# Patient Record
Sex: Female | Born: 1945
Health system: Southern US, Community
[De-identification: ages and names within clinical notes are randomized; demographics above are authoritative.]

## PROBLEM LIST (undated history)

## (undated) DIAGNOSIS — I10 Essential (primary) hypertension: Secondary | ICD-10-CM

## (undated) DIAGNOSIS — M109 Gout, unspecified: Secondary | ICD-10-CM

## (undated) DIAGNOSIS — F419 Anxiety disorder, unspecified: Secondary | ICD-10-CM

## (undated) DIAGNOSIS — C649 Malignant neoplasm of unspecified kidney, except renal pelvis: Secondary | ICD-10-CM

## (undated) DIAGNOSIS — F319 Bipolar disorder, unspecified: Secondary | ICD-10-CM

## (undated) HISTORY — PX: CHOLECYSTECTOMY: SHX55

## (undated) HISTORY — PX: NEPHRECTOMY: SHX65

---

## 2015-11-16 ENCOUNTER — Emergency Department (HOSPITAL_BASED_OUTPATIENT_CLINIC_OR_DEPARTMENT_OTHER): Payer: Medicare Other

## 2015-11-16 ENCOUNTER — Emergency Department (HOSPITAL_BASED_OUTPATIENT_CLINIC_OR_DEPARTMENT_OTHER)
Admission: EM | Admit: 2015-11-16 | Discharge: 2015-11-16 | Disposition: A | Discharge status: Home / Self Care | Payer: Medicare Other | Attending: Emergency Medicine | Admitting: Emergency Medicine

## 2015-11-16 ENCOUNTER — Encounter (HOSPITAL_BASED_OUTPATIENT_CLINIC_OR_DEPARTMENT_OTHER): Payer: Self-pay

## 2015-11-16 DIAGNOSIS — I10 Essential (primary) hypertension: Secondary | ICD-10-CM | POA: Insufficient documentation

## 2015-11-16 DIAGNOSIS — Y929 Unspecified place or not applicable: Secondary | ICD-10-CM | POA: Insufficient documentation

## 2015-11-16 DIAGNOSIS — Y93E5 Activity, floor mopping and cleaning: Secondary | ICD-10-CM | POA: Diagnosis not present

## 2015-11-16 DIAGNOSIS — Y999 Unspecified external cause status: Secondary | ICD-10-CM | POA: Insufficient documentation

## 2015-11-16 DIAGNOSIS — Z85528 Personal history of other malignant neoplasm of kidney: Secondary | ICD-10-CM | POA: Diagnosis not present

## 2015-11-16 DIAGNOSIS — M545 Low back pain, unspecified: Secondary | ICD-10-CM

## 2015-11-16 DIAGNOSIS — W19XXXA Unspecified fall, initial encounter: Secondary | ICD-10-CM | POA: Insufficient documentation

## 2015-11-16 HISTORY — DX: Essential (primary) hypertension: I10

## 2015-11-16 HISTORY — DX: Gout, unspecified: M10.9

## 2015-11-16 MED ORDER — CYCLOBENZAPRINE HCL 10 MG PO TABS: 5.0000 mg | ORAL_TABLET | Freq: Three times a day (TID) | ORAL | Status: AC | PRN

## 2015-11-16 MED ORDER — KETOROLAC TROMETHAMINE 60 MG/2ML IM SOLN
30.0000 mg | Freq: Once | INTRAMUSCULAR | Status: AC
  Administered 2015-11-16: 30 mg via INTRAMUSCULAR
  Filled 2015-11-16: qty 2

## 2015-11-16 MED ORDER — IBUPROFEN 800 MG PO TABS: 400.0000 mg | ORAL_TABLET | Freq: Three times a day (TID) | ORAL | Status: AC

## 2015-11-16 MED FILL — CYCLOBENZAPRINE 10 MG TAB: 10 | 13 days supply | Qty: 20 | Fill #0

## 2015-11-16 MED FILL — IBUPROFEN 800 MG TABLET: 800 | 14 days supply | Qty: 21 | Fill #0

## 2015-11-16 NOTE — ED Notes (Addendum)
Pt states she fell while mopping Friday-pain to lower back-NAD-took a phone call during triage-steady gait to triage and to tx area

## 2015-11-17 NOTE — ED Provider Notes (Signed)
CSN: [id number]     Arrival date & time 11/16/15  1331 History   First MD Initiated Contact with Patient 11/16/15 1354     Chief Complaint  Patient presents with  . Fall     (Consider location/radiation/quality/duration/timing/severity/associated sxs/prior Treatment) Patient is a 70 y.o. female presenting with back pain.  Back Pain Pain location: left lower back. Quality:  Aching and stiffness Pain severity:  Mild Timing:  Intermittent Chronicity:  New Relieved by:  None tried Worsened by:  Nothing tried Associated symptoms: no fever     Past Medical History  Diagnosis Date  . FH: kidney cancer   . Gout   . Hypertension    Past Surgical History  Procedure Laterality Date  . Cholecystectomy    . Nephrectomy     No family history on file. Social History  Substance Use Topics  . Smoking status: Never Smoker   . Smokeless tobacco: None  . Alcohol Use: No   OB History    No data available     Review of Systems  Constitutional: Negative for fever and chills.  Respiratory: Negative for cough and shortness of breath.   Endocrine: Negative for polyuria.  Musculoskeletal: Positive for back pain. Negative for gait problem.  Neurological: Negative for dizziness.  All other systems reviewed and are negative.     Allergies  Review of patient's allergies indicates no known allergies.  Home Medications   Prior to Admission medications   Medication Sig Start Date End Date Taking? Authorizing Provider  cyclobenzaprine (FLEXERIL) 10 MG tablet Take 0.5 tablets (5 mg total) by mouth 3 (three) times daily as needed for muscle spasms. 11/16/15   Merrily Pew, MD  ibuprofen (ADVIL,MOTRIN) 800 MG tablet Take 0.5 tablets (400 mg total) by mouth 3 (three) times daily. 11/16/15   Merrily Pew, MD  UNKNOWN TO PATIENT Does not know meds   Yes Historical Provider, MD   BP 133/78 mmHg  Pulse 70  Temp(Src) 98.9 F (37.2 C) (Oral)  Resp 18  Ht 5\' 2"  (1.575 m)  Wt 215 lb (97.523  kg)  BMI 39.31 kg/m2  SpO2 99% Physical Exam  Constitutional: She appears well-developed and well-nourished.  HENT:  Head: Normocephalic and atraumatic.  Neck: Normal range of motion.  Cardiovascular: Normal rate and regular rhythm.   Pulmonary/Chest: No stridor. No respiratory distress.  Abdominal: She exhibits no distension.  Musculoskeletal: She exhibits tenderness (left lower back).  Neurological: She is alert.  Nursing note and vitals reviewed.   ED Course  Procedures (including critical care time) Labs Review Labs Reviewed - No data to display  Imaging Review Dg Thoracic Spine 2 View  11/16/2015  CLINICAL DATA:  Acute lower back pain after fall five days ago. EXAM: THORACIC SPINE 2 VIEWS COMPARISON:  None. FINDINGS: There is no evidence of thoracic spine fracture. Alignment is normal. No other significant bone abnormalities are identified. IMPRESSION: Normal thoracic spine. Electronically Signed   By: Marijo Conception, M.D.   On: 11/16/2015 15:11   Dg Lumbar Spine 2-3 Views  11/16/2015  CLINICAL DATA:  Fall on Friday with lower back pain. Initial encounter. EXAM: LUMBAR SPINE - 2-3 VIEW COMPARISON:  None. FINDINGS: L5 superior endplate concavity attributed to age-indeterminate fracture. A L5 inferior endplate concavity is chronic and smooth appearing. There is advanced lower lumbar facet arthropathy with mild L4-5 anterolisthesis. No focal bone lesion or endplate erosion. IMPRESSION: 1. Age-indeterminate mildly depressed L5 superior endplate fracture. 2. Advanced lower lumbar facet arthropathy with  mild L4-5 anterolisthesis. Electronically Signed   By: Monte Fantasia M.D.   On: 11/16/2015 15:17   I have personally reviewed and evaluated these images and lab results as part of my medical decision-making.   EKG Interpretation None      MDM   Final diagnoses:  Right-sided low back pain without sciatica    Mechanical fall with likely muscular back pain intermittently since  then. 2/2 age XR's done and showed likely old fx. No new fractures or significant fx. Will dc on NSAIDs and muscle relaxers.   New Prescriptions: Discharge Medication List as of 11/16/2015  3:17 PM    START taking these medications   Details  cyclobenzaprine (FLEXERIL) 10 MG tablet Take 0.5 tablets (5 mg total) by mouth 3 (three) times daily as needed for muscle spasms., Starting 11/16/2015, Until Discontinued, Print    ibuprofen (ADVIL,MOTRIN) 800 MG tablet Take 0.5 tablets (400 mg total) by mouth 3 (three) times daily., Starting 11/16/2015, Until Discontinued, Print         I have personally and contemperaneously reviewed labs and imaging and used in my decision making as above.   A medical screening exam was performed and I feel the patient has had an appropriate workup for their chief complaint at this time and likelihood of emergent condition existing is low and thus workup can continue on an outpatient basis.. Their vital signs are stable. They have been counseled on decision, discharge, follow up and which symptoms necessitate immediate return to the emergency department.  They verbally stated understanding and agreement with plan and discharged in stable condition.      Merrily Pew, MD 11/17/15 [phone]

## 2016-07-29 ENCOUNTER — Emergency Department (HOSPITAL_BASED_OUTPATIENT_CLINIC_OR_DEPARTMENT_OTHER)
Admission: EM | Admit: 2016-07-29 | Discharge: 2016-07-29 | Disposition: A | Discharge status: Home / Self Care | Payer: Medicare Other | Attending: Emergency Medicine | Admitting: Emergency Medicine

## 2016-07-29 ENCOUNTER — Encounter (HOSPITAL_BASED_OUTPATIENT_CLINIC_OR_DEPARTMENT_OTHER): Payer: Self-pay | Admitting: Emergency Medicine

## 2016-07-29 ENCOUNTER — Emergency Department (HOSPITAL_BASED_OUTPATIENT_CLINIC_OR_DEPARTMENT_OTHER): Payer: Medicare Other

## 2016-07-29 DIAGNOSIS — M79674 Pain in right toe(s): Secondary | ICD-10-CM | POA: Insufficient documentation

## 2016-07-29 DIAGNOSIS — S99921A Unspecified injury of right foot, initial encounter: Secondary | ICD-10-CM | POA: Diagnosis present

## 2016-07-29 DIAGNOSIS — W228XXA Striking against or struck by other objects, initial encounter: Secondary | ICD-10-CM | POA: Diagnosis not present

## 2016-07-29 DIAGNOSIS — Y939 Activity, unspecified: Secondary | ICD-10-CM | POA: Diagnosis not present

## 2016-07-29 DIAGNOSIS — I10 Essential (primary) hypertension: Secondary | ICD-10-CM | POA: Diagnosis not present

## 2016-07-29 DIAGNOSIS — Y929 Unspecified place or not applicable: Secondary | ICD-10-CM | POA: Insufficient documentation

## 2016-07-29 DIAGNOSIS — Y999 Unspecified external cause status: Secondary | ICD-10-CM | POA: Insufficient documentation

## 2016-07-29 DIAGNOSIS — Z79899 Other long term (current) drug therapy: Secondary | ICD-10-CM | POA: Diagnosis not present

## 2016-07-29 HISTORY — DX: Anxiety disorder, unspecified: F41.9

## 2016-07-29 HISTORY — DX: Bipolar disorder, unspecified: F31.9

## 2016-07-29 NOTE — ED Provider Notes (Signed)
[facility] Provider Note   CSN: [id number] Arrival date & time: 07/29/16  [id number]     History   Chief Complaint Chief Complaint  Patient presents with  . Toe Injury    HPI Patricia Quinn is a 71 y.o. female.  HPI here for valuation of right great toe pain. Patient reports she was getting out of bed this morning at 3 AM to use the restroom and accidentally stubbed her right great toe on the bed frame. She reports sudden onset of sharp pain. Walking and certain movements worsen the discomfort. She took Tylenol with some relief. She denies any numbness, weakness, ankle pain, fevers or chills.  Past Medical History:  Diagnosis Date  . Anxiety   . Bipolar 1 disorder ([place])   . FH: kidney cancer   . Gout   . Hypertension     There are no active problems to display for this patient.   Past Surgical History:  Procedure Laterality Date  . CHOLECYSTECTOMY    . NEPHRECTOMY      OB History    No data available       Home Medications    Prior to Admission medications   Medication Sig Start Date End Date Taking? Authorizing Provider  ALLOPURINOL PO Take by mouth.   Yes Historical Provider, MD  Divalproex Sodium (DEPAKOTE PO) Take by mouth.   Yes Historical Provider, MD  FUROSEMIDE PO Take by mouth.   Yes Historical Provider, MD  METOPROLOL TARTRATE PO Take by mouth.   Yes Historical Provider, MD  cyclobenzaprine (FLEXERIL) 10 MG tablet Take 0.5 tablets (5 mg total) by mouth 3 (three) times daily as needed for muscle spasms. 11/16/15   Merrily Pew, MD  ibuprofen (ADVIL,MOTRIN) 800 MG tablet Take 0.5 tablets (400 mg total) by mouth 3 (three) times daily. 11/16/15   Merrily Pew, MD  UNKNOWN TO PATIENT Does not know meds    Historical Provider, MD    Family History History reviewed. No pertinent family history.  Social History Social History  Substance Use Topics  . Smoking status: Never Smoker  . Smokeless tobacco: Never Used  . Alcohol use Yes     Comment:  occ     Allergies   Patient has no known allergies.   Review of Systems Review of Systems See history of present illness  Physical Exam Updated Vital Signs BP 145/67 (BP Location: Right Arm)   Pulse 62   Temp 97.9 F (36.6 C) (Oral)   Resp 18   Ht 5' 2.5" (1.588 m)   Wt 99.8 kg   SpO2 100%   BMI 39.60 kg/m   Physical Exam  Constitutional: She appears well-developed. No distress.  Awake, alert and nontoxic in appearance  HENT:  Head: Normocephalic and atraumatic.  Right Ear: External ear normal.  Left Ear: External ear normal.  Mouth/Throat: Oropharynx is clear and moist.  Eyes: Conjunctivae and EOM are normal. Pupils are equal, round, and reactive to light.  Neck: Normal range of motion. No JVD present.  Cardiovascular: Normal rate, regular rhythm and normal heart sounds.   Pulmonary/Chest: Effort normal. No stridor. No respiratory distress.  Abdominal: Soft. There is no tenderness.  Musculoskeletal: Normal range of motion. She exhibits tenderness.  Tenderness diffusely about the right great toe. Bilateral lower extremity edema at baseline. Sensation intact to light touch distal to injury. Distal pulses intact, brisk cap refill. Mild erythema on right great toe.  Neurological:  Awake, alert, cooperative and aware of situation; motor  strength bilaterally; sensation normal to light touch bilaterally; no facial asymmetry; tongue midline; major cranial nerves appear intact;  baseline gait without new ataxia.  Skin: No rash noted. She is not diaphoretic.  Psychiatric: She has a normal mood and affect. Her behavior is normal. Thought content normal.  Nursing note and vitals reviewed.    ED Treatments / Results  Labs (all labs ordered are listed, but only abnormal results are displayed) Labs Reviewed - No data to display  EKG  EKG Interpretation None       Radiology Dg Foot Complete Right  Result Date: 07/29/2016 CLINICAL DATA:  Right great toe injury. EXAM:  RIGHT FOOT COMPLETE - 3+ VIEW COMPARISON:  None. FINDINGS: There is no evidence of displaced fracture or dislocation. Slight irregularity of the medial cortex of the distal shaft of the proximal first right phalanx may represent avulsion fracture. No other fractures identified, although there is a significant mid to forefoot dorsal soft tissue swelling. IMPRESSION: No evidence of displaced fracture. Possible avulsion fracture at the first interphalangeal joint. Significant dorsal mid to forefoot swelling. If patient's symptoms persist, repeated radiographs are recommended in 7-14 days to exclude nondisplaced radio occult fracture of the midfoot. Electronically Signed   By: Fidela Salisbury M.D.   On: 07/29/2016 09:51    Procedures Procedures (including critical care time)  Medications Ordered in ED Medications - No data to display   Initial Impression / Assessment and Plan / ED Course  I have reviewed the triage vital signs and the nursing notes.  Pertinent labs & imaging results that were available during my care of the patient were reviewed by me and considered in my medical decision making (see chart for details).     X-ray shows possible avulsion fracture at first IP joint of right great toe. Buddy taped, postop shoe. Continue Tylenol. Follow up with PCP next week. Return precautions discussed. Otherwise appears very well and appropriate palpation follow-up.  Final Clinical Impressions(s) / ED Diagnoses   Final diagnoses:  Great toe pain, right    New Prescriptions New Prescriptions   No medications on file     Comer Locket, PA-C 07/29/16 1042    Leo Grosser, MD 07/30/16 1510

## 2016-07-29 NOTE — ED Triage Notes (Signed)
Patient reports right great toe injury which occurred this morning after she hit her toe on the end of the bed.

## 2016-07-29 NOTE — Discharge Instructions (Signed)
Keep your toes taped and continue wearing the flat bottom shoe until you can be seen by your PCP next week. Continue taking Tylenol. Return to ED for any new or worsening symptoms as we discussed

## 2017-09-19 ENCOUNTER — Encounter (HOSPITAL_BASED_OUTPATIENT_CLINIC_OR_DEPARTMENT_OTHER): Payer: Self-pay

## 2017-09-19 ENCOUNTER — Other Ambulatory Visit: Payer: Self-pay

## 2017-09-19 ENCOUNTER — Emergency Department (HOSPITAL_BASED_OUTPATIENT_CLINIC_OR_DEPARTMENT_OTHER): Payer: Medicare Other

## 2017-09-19 ENCOUNTER — Emergency Department (HOSPITAL_BASED_OUTPATIENT_CLINIC_OR_DEPARTMENT_OTHER)
Admission: EM | Admit: 2017-09-19 | Discharge: 2017-09-19 | Disposition: A | Discharge status: Other Acute Inpatient Hospital | Payer: Medicare Other | Attending: Emergency Medicine | Admitting: Emergency Medicine

## 2017-09-19 DIAGNOSIS — Z85528 Personal history of other malignant neoplasm of kidney: Secondary | ICD-10-CM | POA: Diagnosis not present

## 2017-09-19 DIAGNOSIS — K56600 Partial intestinal obstruction, unspecified as to cause: Secondary | ICD-10-CM

## 2017-09-19 DIAGNOSIS — Z79899 Other long term (current) drug therapy: Secondary | ICD-10-CM | POA: Insufficient documentation

## 2017-09-19 DIAGNOSIS — I10 Essential (primary) hypertension: Secondary | ICD-10-CM | POA: Diagnosis not present

## 2017-09-19 DIAGNOSIS — R1010 Upper abdominal pain, unspecified: Secondary | ICD-10-CM | POA: Diagnosis present

## 2017-09-19 HISTORY — DX: Malignant neoplasm of unspecified kidney, except renal pelvis: C64.9

## 2017-09-19 LAB — DIFFERENTIAL
Basophils Absolute: 0 10*3/uL (ref 0.0–0.1)
Basophils Relative: 0 %
EOS ABS: 0.2 10*3/uL (ref 0.0–0.7)
EOS PCT: 3 %
LYMPHS ABS: 2.5 10*3/uL (ref 0.7–4.0)
Lymphocytes Relative: 31 %
MONO ABS: 0.6 10*3/uL (ref 0.1–1.0)
MONOS PCT: 8 %
NEUTROS PCT: 58 %
Neutro Abs: 4.7 10*3/uL (ref 1.7–7.7)

## 2017-09-19 LAB — COMPREHENSIVE METABOLIC PANEL
ALK PHOS: 114 U/L (ref 38–126)
ALT: 19 U/L (ref 14–54)
AST: 36 U/L (ref 15–41)
Albumin: 3.7 g/dL (ref 3.5–5.0)
Anion gap: 11 (ref 5–15)
BILIRUBIN TOTAL: 0.9 mg/dL (ref 0.3–1.2)
BUN: 25 mg/dL — ABNORMAL HIGH (ref 6–20)
CALCIUM: 9.5 mg/dL (ref 8.9–10.3)
CO2: 21 mmol/L — ABNORMAL LOW (ref 22–32)
Chloride: 106 mmol/L (ref 101–111)
Creatinine, Ser: 1.33 mg/dL — ABNORMAL HIGH (ref 0.44–1.00)
GFR calc non Af Amer: 39 mL/min — ABNORMAL LOW (ref >60)
GFR, EST AFRICAN AMERICAN: 45 mL/min — AB (ref >60)
GLUCOSE: 122 mg/dL — AB (ref 65–99)
Potassium: 4.2 mmol/L (ref 3.5–5.1)
Sodium: 138 mmol/L (ref 135–145)
TOTAL PROTEIN: 8.8 g/dL — AB (ref 6.5–8.1)

## 2017-09-19 LAB — URINALYSIS, ROUTINE W REFLEX MICROSCOPIC
BILIRUBIN URINE: NEGATIVE
Glucose, UA: NEGATIVE mg/dL
Hgb urine dipstick: NEGATIVE
Ketones, ur: NEGATIVE mg/dL
NITRITE: NEGATIVE
PROTEIN: NEGATIVE mg/dL
SPECIFIC GRAVITY, URINE: 1.01 (ref 1.005–1.030)
pH: 7.5 (ref 5.0–8.0)

## 2017-09-19 LAB — URINALYSIS, MICROSCOPIC (REFLEX)

## 2017-09-19 LAB — CBC
HCT: 45.2 % (ref 36.0–46.0)
HEMOGLOBIN: 14.6 g/dL (ref 12.0–15.0)
MCH: 29 pg (ref 26.0–34.0)
MCHC: 32.3 g/dL (ref 30.0–36.0)
MCV: 89.7 fL (ref 78.0–100.0)
Platelets: 149 10*3/uL — ABNORMAL LOW (ref 150–400)
RBC: 5.04 MIL/uL (ref 3.87–5.11)
RDW: 14.4 % (ref 11.5–15.5)
WBC: 7.8 10*3/uL (ref 4.0–10.5)

## 2017-09-19 LAB — LIPASE, BLOOD: Lipase: 69 U/L — ABNORMAL HIGH (ref 11–51)

## 2017-09-19 MED ORDER — ONDANSETRON HCL 4 MG/2ML IJ SOLN
4.0000 mg | Freq: Once | INTRAMUSCULAR | Status: AC
  Administered 2017-09-19: 4 mg via INTRAVENOUS
  Filled 2017-09-19: qty 2

## 2017-09-19 MED ORDER — FENTANYL CITRATE (PF) 100 MCG/2ML IJ SOLN
50.0000 ug | INTRAMUSCULAR | Status: DC | PRN
  Administered 2017-09-19 (×2): 50 ug via INTRAVENOUS
  Filled 2017-09-19 (×2): qty 2

## 2017-09-19 MED ORDER — PROMETHAZINE HCL 25 MG/ML IJ SOLN
12.5000 mg | Freq: Once | INTRAMUSCULAR | Status: AC
  Administered 2017-09-19: 12.5 mg via INTRAVENOUS
  Filled 2017-09-19: qty 1

## 2017-09-19 NOTE — ED Notes (Signed)
ED Provider at bedside. 

## 2017-09-19 NOTE — ED Provider Notes (Addendum)
7:45 AM resting comfortably.  Denies complaint appears no distress alert, appropriate   Patricia Dakin, MD 09/19/17 [place], [place], MD 09/19/17 [phone]

## 2017-09-19 NOTE — ED Notes (Signed)
Patient transported to CT 

## 2017-09-19 NOTE — ED Provider Notes (Signed)
[facility] Provider Note: Georgena Spurling, MD, FACEP  CSN: [id number] MRN: [id number] ARRIVAL: 09/19/17 at [place]: [place]  Abdominal Pain   HISTORY OF PRESENT ILLNESS  09/19/17 3:23 AM Patricia Quinn is a 72 y.o. female who complains of upper abdominal pain that began about 10 PM yesterday evening.  She rates her pain as a 9 out of 10 and rates it is sharp.  It is worse with movement or palpation.  She feels her abdomen is distended.  She has had nausea and 3 episodes of vomiting.  She denies diarrhea or constipation.  She has a history of nephrectomy for kidney cancer.  She has also had a cholecystectomy.   Past Medical History:  Diagnosis Date  . Anxiety   . Bipolar 1 disorder ([place])   . Cancer of kidney ([place])   . Gout   . Hypertension     Past Surgical History:  Procedure Laterality Date  . CHOLECYSTECTOMY    . NEPHRECTOMY      No family history on file.  Social History   Tobacco Use  . Smoking status: Never Smoker  . Smokeless tobacco: Never Used  Substance Use Topics  . Alcohol use: Yes    Comment: occ  . Drug use: No    Prior to Admission medications   Medication Sig Start Date End Date Taking? Authorizing Provider  ALLOPURINOL PO Take by mouth.    [provider]  cyclobenzaprine (FLEXERIL) 10 MG tablet Take 0.5 tablets (5 mg total) by mouth 3 (three) times daily as needed for muscle spasms. 11/16/15   Mesner, Corene Cornea, MD  Divalproex Sodium (DEPAKOTE PO) Take by mouth.    [provider]  FUROSEMIDE PO Take by mouth.    [provider]  ibuprofen (ADVIL,MOTRIN) 800 MG tablet Take 0.5 tablets (400 mg total) by mouth 3 (three) times daily. 11/16/15   Mesner, Corene Cornea, MD  METOPROLOL TARTRATE PO Take by mouth.    [provider]  UNKNOWN TO PATIENT Does not know meds    [provider]    Allergies Allopurinol; Hydroxyzine hcl; and Hydrochlorothiazide   REVIEW OF SYSTEMS  Negative  except as noted here or in the History of Present Illness.   PHYSICAL EXAMINATION  Initial Vital Signs Blood pressure (!) 187/101, pulse 86, temperature 97.9 F (36.6 C), temperature source Oral, resp. rate 20, height 5\' 2"  (1.575 m), weight 90.7 kg (200 lb), SpO2 100 %.  Examination General: Well-developed, well-nourished female in no acute distress; appearance consistent with age of record HENT: normocephalic; atraumatic Eyes: pupils equal, round and reactive to light; extraocular muscles intact Neck: supple Heart: regular rate and rhythm Lungs: clear to auscultation bilaterally Abdomen: soft; mildly distended; upper abdominal tenderness; bowel sounds present Extremities: No deformity; full range of motion; pulses normal Neurologic: Awake, alert and oriented; motor function intact in all extremities and symmetric; no facial droop Skin: Warm and dry Psychiatric: Flat affect   RESULTS  Summary of this visit's results, reviewed by myself:   EKG Interpretation  Date/Time:    Ventricular Rate:    PR Interval:    QRS Duration:   QT Interval:    QTC Calculation:   R Axis:     Text Interpretation:        Laboratory Studies: Results for orders placed or performed during the hospital encounter of 09/19/17 (from the past 24 hour(s))  Lipase, blood     Status: Abnormal   Collection Time:  09/19/17  3:24 AM  Result Value Ref Range   Lipase 69 (H) 11 - 51 U/L  Comprehensive metabolic panel     Status: Abnormal   Collection Time: 09/19/17  3:24 AM  Result Value Ref Range   Sodium 138 135 - 145 mmol/L   Potassium 4.2 3.5 - 5.1 mmol/L   Chloride 106 101 - 111 mmol/L   CO2 21 (L) 22 - 32 mmol/L   Glucose, Bld 122 (H) 65 - 99 mg/dL   BUN 25 (H) 6 - 20 mg/dL   Creatinine, Ser 1.33 (H) 0.44 - 1.00 mg/dL   Calcium 9.5 8.9 - 10.3 mg/dL   Total Protein 8.8 (H) 6.5 - 8.1 g/dL   Albumin 3.7 3.5 - 5.0 g/dL   AST 36 15 - 41 U/L   ALT 19 14 - 54 U/L   Alkaline Phosphatase 114 38 - 126  U/L   Total Bilirubin 0.9 0.3 - 1.2 mg/dL   GFR calc non Af Amer 39 (L) >60 mL/min   GFR calc Af Amer 45 (L) >60 mL/min   Anion gap 11 5 - 15  CBC     Status: Abnormal   Collection Time: 09/19/17  3:24 AM  Result Value Ref Range   WBC 7.8 4.0 - 10.5 K/uL   RBC 5.04 3.87 - 5.11 MIL/uL   Hemoglobin 14.6 12.0 - 15.0 g/dL   HCT 45.2 36.0 - 46.0 %   MCV 89.7 78.0 - 100.0 fL   MCH 29.0 26.0 - 34.0 pg   MCHC 32.3 30.0 - 36.0 g/dL   RDW 14.4 11.5 - 15.5 %   Platelets 149 (L) 150 - 400 K/uL  Urinalysis, Routine w reflex microscopic     Status: Abnormal   Collection Time: 09/19/17  3:24 AM  Result Value Ref Range   Color, Urine YELLOW YELLOW   APPearance CLEAR CLEAR   Specific Gravity, Urine 1.010 1.005 - 1.030   pH 7.5 5.0 - 8.0   Glucose, UA NEGATIVE NEGATIVE mg/dL   Hgb urine dipstick NEGATIVE NEGATIVE   Bilirubin Urine NEGATIVE NEGATIVE   Ketones, ur NEGATIVE NEGATIVE mg/dL   Protein, ur NEGATIVE NEGATIVE mg/dL   Nitrite NEGATIVE NEGATIVE   Leukocytes, UA SMALL (A) NEGATIVE  Urinalysis, Microscopic (reflex)     Status: Abnormal   Collection Time: 09/19/17  3:24 AM  Result Value Ref Range   RBC / HPF 0-5 0 - 5 RBC/hpf   WBC, UA 6-30 0 - 5 WBC/hpf   Bacteria, UA MANY (A) NONE SEEN   Squamous Epithelial / LPF 6-30 (A) NONE SEEN   Imaging Studies: Ct Abdomen Pelvis Wo Contrast  Result Date: 09/19/2017 CLINICAL DATA:  Acute abdominal pain. EXAM: CT ABDOMEN AND PELVIS WITHOUT CONTRAST TECHNIQUE: Multidetector CT imaging of the abdomen and pelvis was performed following the standard protocol without IV contrast. COMPARISON:  CT 07/16/2017, 06/19/2017 FINDINGS: Lower chest: Small right pleural effusion and minimal left pleural thickening, with decreased from prior exam. Mild lung base atelectasis. Hepatobiliary: Decreased hepatic density consistent with steatosis. Nodular hepatic contours consistent with cirrhosis. Postcholecystectomy with unchanged biliary prominence. Pancreas: No ductal  dilatation or inflammation. Spleen: Normal in size without focal abnormality. Adrenals/Urinary Tract: Normal adrenal glands. Post right nephrectomy. No left hydronephrosis. Urinary bladder is nondistended. Stomach/Bowel: Minimal enteric contrast in the distal esophagus. Mild gastric distention with enteric contrast. Small bowel is prominent and fluid-filled. Mild mesenteric edema in the lower abdomen. Tentative transition in the anterior lower abdomen/pelvis image 76 series 9  were there is fecalized small bowel contents. More distal small bowel appears decompressed. Appendix is tentatively identified and normal. Small volume of stool in the colon. Minimal diverticulosis. No colonic inflammation. Vascular/Lymphatic: Aortic atherosclerosis. Small upper pericaval nodes are similar to prior exam. There is no bulky adenopathy. Reproductive: Endometrial prominence on prior exam is not as well visualized given lack contrast. No adnexal mass. Other: Previous right retroperitoneal fluid collection has resolved. Minimal residual retroperitoneal and pericolic gutter stranding. Small amount of free fluid in the pelvis. No new intra-abdominal fluid collection. No free air. Musculoskeletal: Chronic mild L5 compression fracture. Stable degenerative change throughout spine. There are no acute or suspicious osseous abnormalities. IMPRESSION: 1. Findings suspicious for early partial small bowel obstruction with transition point in the central lower abdomen. 2. Previous right retroperitoneal septated fluid collection has resolved. Minimal residual stranding. 3. Hepatic cirrhosis. 4.  Aortic Atherosclerosis (ICD10-I70.0). Electronically Signed   By: Jeb Levering M.D.   On: 09/19/2017 05:34    ED COURSE  Nursing notes and initial vitals signs, including pulse oximetry, reviewed.  Vitals:   09/19/17 0302 09/19/17 0531  BP: (!) 187/101 137/69  Pulse: 86 71  Resp: 20 16  Temp: 97.9 F (36.6 C)   TempSrc: Oral   SpO2: 100%  97%  Weight: 90.7 kg (200 lb)   Height: 5\' 2"  (1.575 m)    5:52 AM Patient stable and pain and nausea been controlled with medications.  Dr. Naida Sleight has accepted patient for transfer to [facility]    ED DIAGNOSES     ICD-10-CM   1. Partial small bowel obstruction ([place]) K56.600        Aryah Doering, Jenny Reichmann, MD 09/19/17 [phone]

## 2017-09-19 NOTE — ED Triage Notes (Signed)
Pt c/o upper abdominal pain and three episodes of vomiting that started after dinner tonight, tried tylenol without relief

## 2018-07-24 ENCOUNTER — Emergency Department (HOSPITAL_BASED_OUTPATIENT_CLINIC_OR_DEPARTMENT_OTHER): Payer: Medicare Other

## 2018-07-24 ENCOUNTER — Emergency Department (HOSPITAL_BASED_OUTPATIENT_CLINIC_OR_DEPARTMENT_OTHER)
Admission: EM | Admit: 2018-07-24 | Discharge: 2018-07-24 | Disposition: A | Discharge status: Home / Self Care | Payer: Medicare Other | Attending: Emergency Medicine | Admitting: Emergency Medicine

## 2018-07-24 ENCOUNTER — Encounter (HOSPITAL_BASED_OUTPATIENT_CLINIC_OR_DEPARTMENT_OTHER): Payer: Self-pay | Admitting: *Deleted

## 2018-07-24 ENCOUNTER — Other Ambulatory Visit: Payer: Self-pay

## 2018-07-24 DIAGNOSIS — Z79899 Other long term (current) drug therapy: Secondary | ICD-10-CM | POA: Insufficient documentation

## 2018-07-24 DIAGNOSIS — R2242 Localized swelling, mass and lump, left lower limb: Secondary | ICD-10-CM | POA: Diagnosis not present

## 2018-07-24 DIAGNOSIS — I10 Essential (primary) hypertension: Secondary | ICD-10-CM | POA: Diagnosis not present

## 2018-07-24 DIAGNOSIS — M79605 Pain in left leg: Secondary | ICD-10-CM | POA: Diagnosis present

## 2018-07-24 DIAGNOSIS — M25562 Pain in left knee: Secondary | ICD-10-CM | POA: Insufficient documentation

## 2018-07-24 MED ORDER — DICLOFENAC SODIUM 1 % TD GEL: 4.0000 g | Freq: Four times a day (QID) | TRANSDERMAL | 0 refills | Status: AC

## 2018-07-24 MED ORDER — ACETAMINOPHEN 500 MG PO TABS
1000.0000 mg | ORAL_TABLET | Freq: Once | ORAL | Status: AC
  Administered 2018-07-24: 1000 mg via ORAL
  Filled 2018-07-24: qty 2

## 2018-07-24 NOTE — Discharge Instructions (Addendum)
Your pain is likely due to a flareup of your arthritis due to your recent car ride.  We have given you a knee brace in order to help with the small swelling that is happening in your leg.  You may use Tylenol 650mg  every 6 hours as needed for pain. We have also given you voltaren gel which you may apply to your knee to help with the pain.  If you have any worsening pain, swelling, develop fevers, or redness of your knee then please do not hesitate to return to the emergency room.

## 2018-07-24 NOTE — ED Triage Notes (Signed)
Left leg has been painful X 2 days.

## 2018-07-24 NOTE — ED Provider Notes (Signed)
[facility] DEPART[facility] Provider Note   CSN: [id number] Arrival date & time: 07/24/18  0710   History   Chief Complaint Chief Complaint  Patient presents with  . Leg Pain    HPI Patricia Quinn is a 73 y.o. female with PMH RCC s/p nephrectomy 2002, CKD III, gout, HTN, bipolar, .  Patient reports that she woke up 2 days ago with severe left leg pain.  States that it is worse in her left knee.  Reports that she is still able to walk and bend her knee however it is extremely painful.  She has tried Tylenol which did not help her at all.  Patient here today because her pain was not getting any better.  Denies any redness of the knee.  Denies any fevers or chills.  Patient reports that she always has swelling in her legs and she has not noticed any worsening of the swelling. Does report to other provider that she was recently in a long car ride from [place].     Past Medical History:  Diagnosis Date  . Anxiety   . Bipolar 1 disorder ([place])   . Cancer of kidney ([place])   . Gout   . Hypertension     There are no active problems to display for this patient.   Past Surgical History:  Procedure Laterality Date  . CHOLECYSTECTOMY    . NEPHRECTOMY       OB History   No obstetric history on file.      Home Medications    Prior to Admission medications   Medication Sig Start Date End Date Taking? Authorizing Provider  ALLOPURINOL PO Take by mouth.   Yes [provider]  cyclobenzaprine (FLEXERIL) 10 MG tablet Take 0.5 tablets (5 mg total) by mouth 3 (three) times daily as needed for muscle spasms. 11/16/15  Yes Mesner, Corene Cornea, MD  Divalproex Sodium (DEPAKOTE PO) Take by mouth.   Yes [provider]  FUROSEMIDE PO Take by mouth.   Yes [provider]  UNKNOWN TO PATIENT Does not know meds   Yes [provider]  diclofenac sodium (VOLTAREN) 1 % GEL Apply 4 g topically 4 (four) times daily. 07/24/18   Rashae Rother, [place], DO  ibuprofen  (ADVIL,MOTRIN) 800 MG tablet Take 0.5 tablets (400 mg total) by mouth 3 (three) times daily. 11/16/15   Mesner, Corene Cornea, MD  METOPROLOL TARTRATE PO Take by mouth.    [provider]    Family History History reviewed. No pertinent family history.  Social History Social History   Tobacco Use  . Smoking status: Never Smoker  . Smokeless tobacco: Never Used  Substance Use Topics  . Alcohol use: Yes    Comment: occ  . Drug use: No     Allergies   Allopurinol; Hydroxyzine hcl; and Hydrochlorothiazide   Review of Systems Review of Systems  Constitutional: Negative for chills and fever.  HENT: Negative for congestion.   Respiratory: Negative for shortness of breath.   Cardiovascular: Positive for leg swelling. Negative for chest pain.  Gastrointestinal: Negative for diarrhea, nausea and vomiting.  Genitourinary: Negative for dysuria.  Musculoskeletal: Positive for myalgias. Negative for gait problem and joint swelling.  Neurological: Negative for headaches.  All other systems reviewed and are negative.    Physical Exam Updated Vital Signs BP (!) 150/73 (BP Location: Right Arm)   Pulse 66   Temp 97.9 F (36.6 C) (Oral)   Resp 16   Ht 5' 2.5" (1.588 m)  Wt 95.3 kg   SpO2 98%   BMI 37.80 kg/m   Physical Exam Vitals signs and nursing note reviewed.  Constitutional:      General: She is not in acute distress.    Appearance: Normal appearance. She is normal weight.  HENT:     Head: Normocephalic and atraumatic.     Mouth/Throat:     Mouth: Mucous membranes are moist.  Eyes:     Extraocular Movements: Extraocular movements intact.     Pupils: Pupils are equal, round, and reactive to light.  Neck:     Musculoskeletal: Normal range of motion.  Pulmonary:     Effort: Pulmonary effort is normal.  Musculoskeletal:     Left knee: She exhibits no swelling and no erythema. Tenderness found.     Right lower leg: Edema present.     Left lower leg: Edema present.    Skin:    General: Skin is warm.     Findings: No erythema or rash.  Neurological:     General: No focal deficit present.     Mental Status: She is alert and oriented to person, place, and time.  Psychiatric:        Mood and Affect: Mood normal.        Behavior: Behavior normal.        Thought Content: Thought content normal.        Judgment: Judgment normal.    ED Treatments / Results  Labs (all labs ordered are listed, but only abnormal results are displayed) Labs Reviewed - No data to display  EKG None  Radiology US Venous Img Lower  Left (dvt Study)  Result Date: 07/24/2018 CLINICAL DATA:  Left lower extremity pain for 2 days EXAM: LEFT LOWER EXTREMITY VENOUS DOPPLER ULTRASOUND TECHNIQUE: Gray-scale sonography with graded compression, as well as color Doppler and duplex ultrasound were performed to evaluate the lower extremity deep venous systems from the level of the common femoral vein and including the common femoral, femoral, profunda femoral, popliteal and calf veins including the posterior tibial, peroneal and gastrocnemius veins when visible. The superficial great saphenous vein was also interrogated. Spectral Doppler was utilized to evaluate flow at rest and with distal augmentation maneuvers in the common femoral, femoral and popliteal veins. COMPARISON:  None. FINDINGS: Contralateral Common Femoral Vein: Respiratory phasicity is normal and symmetric with the symptomatic side. No evidence of thrombus. Normal compressibility. Common Femoral Vein: No evidence of thrombus. Normal compressibility, respiratory phasicity and response to augmentation. Saphenofemoral Junction: No evidence of thrombus. Normal compressibility and flow on color Doppler imaging. Profunda Femoral Vein: No evidence of thrombus. Normal compressibility and flow on color Doppler imaging. Femoral Vein: No evidence of thrombus. Normal compressibility, respiratory phasicity and response to augmentation. Popliteal  Vein: No evidence of thrombus. Normal compressibility, respiratory phasicity and response to augmentation. Calf Veins: No evidence of thrombus. Normal compressibility and flow on color Doppler imaging. Superficial Great Saphenous Vein: No evidence of thrombus. Normal compressibility. Venous Reflux:  None. Other Findings:  None. IMPRESSION: No evidence of deep venous thrombosis. Electronically Signed   By: Inez Catalina M.D.   On: 07/24/2018 08:50   Dg Knee Complete 4 Views Left  Result Date: 07/24/2018 CLINICAL DATA:  Pain and swelling EXAM: LEFT KNEE - COMPLETE 4+ VIEW COMPARISON:  None. FINDINGS: Frontal, lateral, and bilateral oblique views were obtained. There is no fracture or dislocation. There is a small joint effusion. There is generalized joint space narrowing with spurring in all compartments. No  erosive changes. IMPRESSION: Generalized osteoarthritic change. Small joint effusion. No fracture or dislocation. Electronically Signed   By: Lowella Grip III M.D.   On: 07/24/2018 08:21    Procedures Procedures (including critical care time)  Medications Ordered in ED Medications  acetaminophen (TYLENOL) tablet 1,000 mg (1,000 mg Oral Given 07/24/18 0807)    Initial Impression / Assessment and Plan / ED Course  I have reviewed the triage vital signs and the nursing notes.  Pertinent labs & imaging results that were available during my care of the patient were reviewed by me and considered in my medical decision making (see chart for details).    73 yo here with left knee pain for 2 days. Has h/o gout and was told to stop colchicine and uloric 2/2 kidney function in November. DDx includes gout vs DVT vs arthritis. Will obtain DG left knee to see if worsening DJD or any effusion. Will obtain LLE [place] to r/o DVT given recent travel with tenderness and swelling.   DG L knee with generalized OA and small effusion. [place] with no evidence of DVT. Less likely gout given no redness on exam. Likely  related to arthritis given DG findings. Will apply knee brace and patient to use tylneol and voltaren gel for pain as well as ice or heat therapy. Patient to follow up with her regular doctor. Given strict return precautions.   [place] Dereona Kolodny, DO PGY-2, [facility]   Final Clinical Impressions(s) / ED Diagnoses   Final diagnoses:  Acute pain of left knee    ED Discharge Orders         Ordered    diclofenac sodium (VOLTAREN) 1 % GEL  4 times daily     07/24/18 [place], [place], DO 07/24/18 [place], MD 07/24/18 1141

## 2020-09-06 IMAGING — US US EXTREM LOW VENOUS*L*
1 series · 13 of 24 positions shown · non-contrast
Comparison: None.

CLINICAL DATA: Left lower extremity pain for 2 days



[Series 1: us extrem low venous*left* · 0.09mm/px · 13 of 36 slices shown]
[im 1/36]
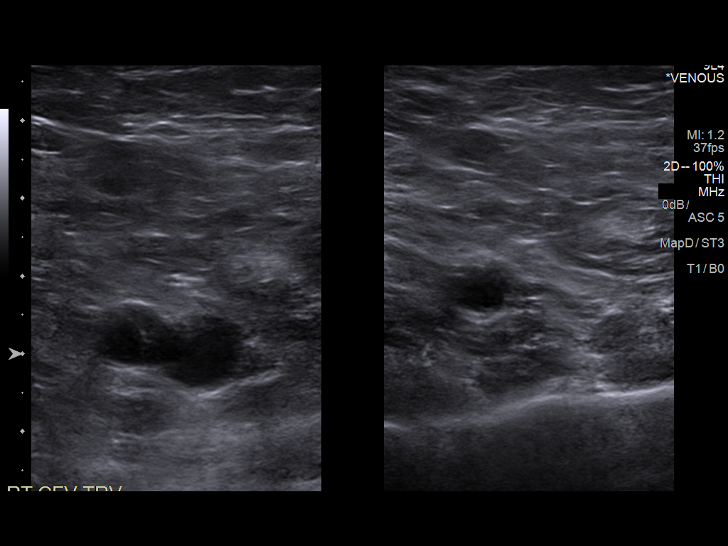
[im 4/36]
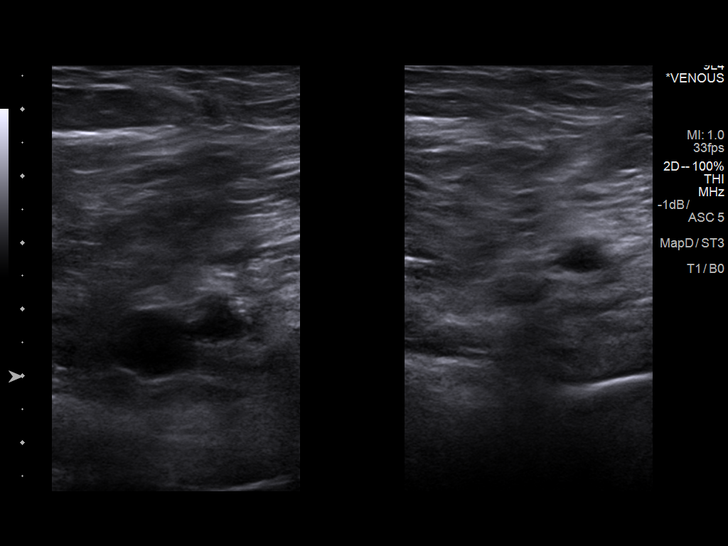
[im 7/36]
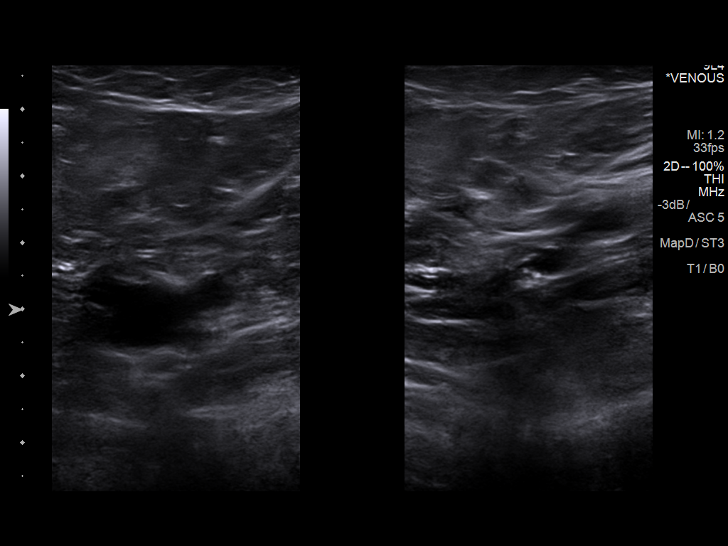
[im 10/36]
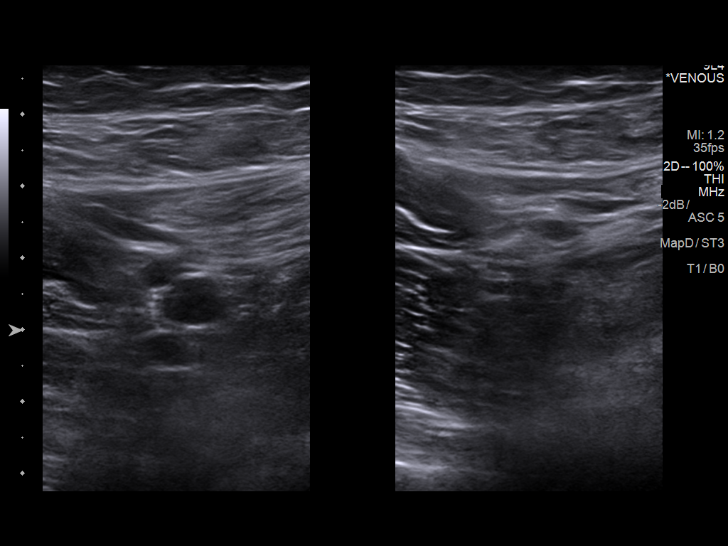
[im 13/36]
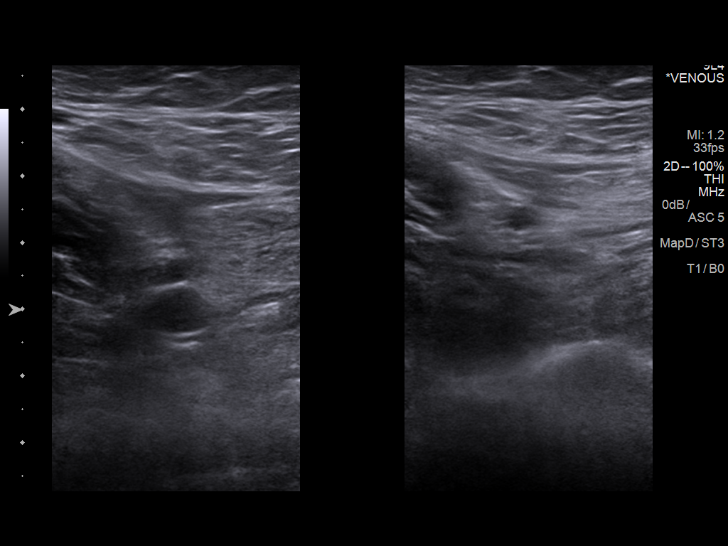
[im 16/36]
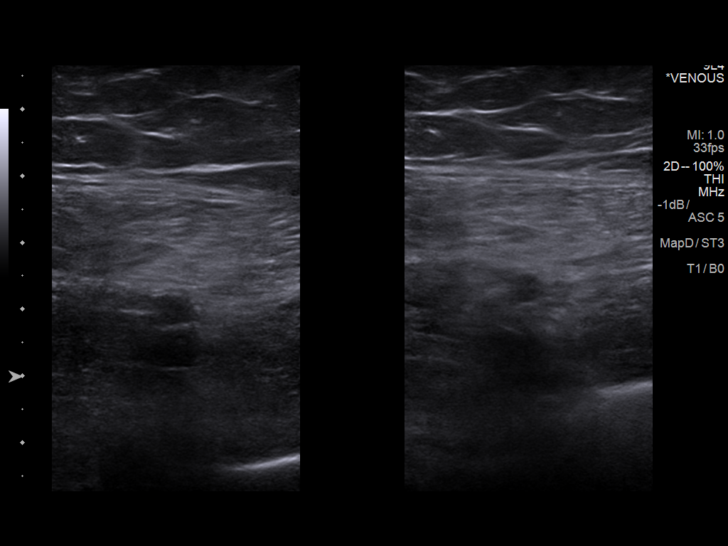
[im 19/36]
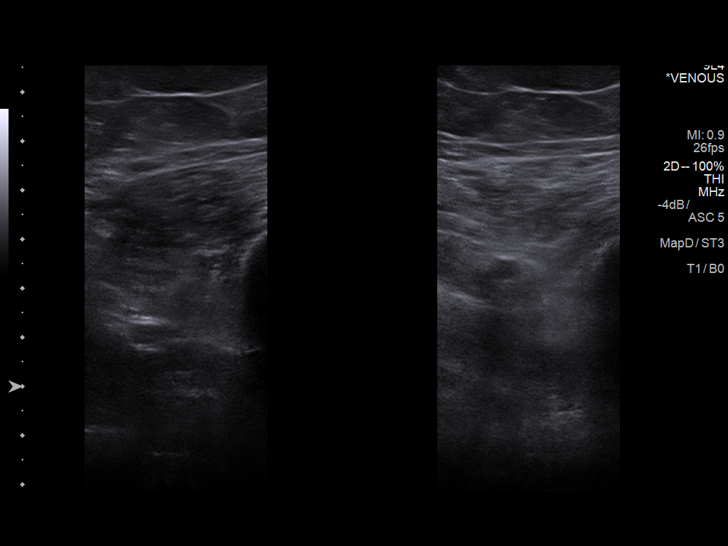
[im 20/36]
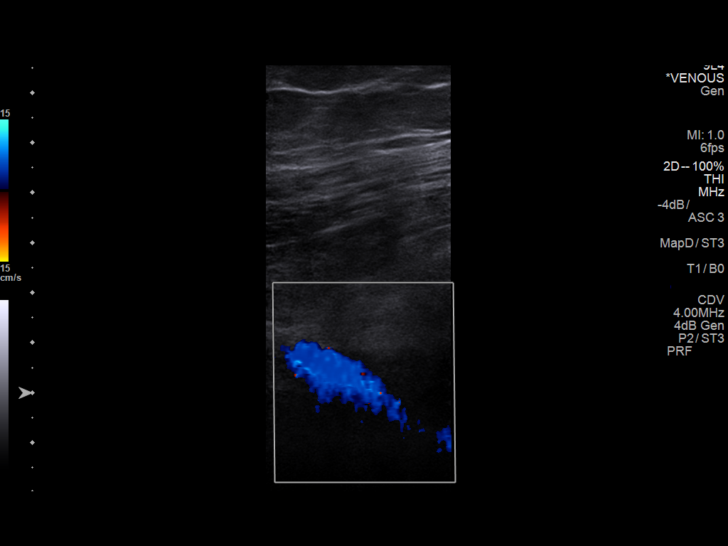
[im 23/36]
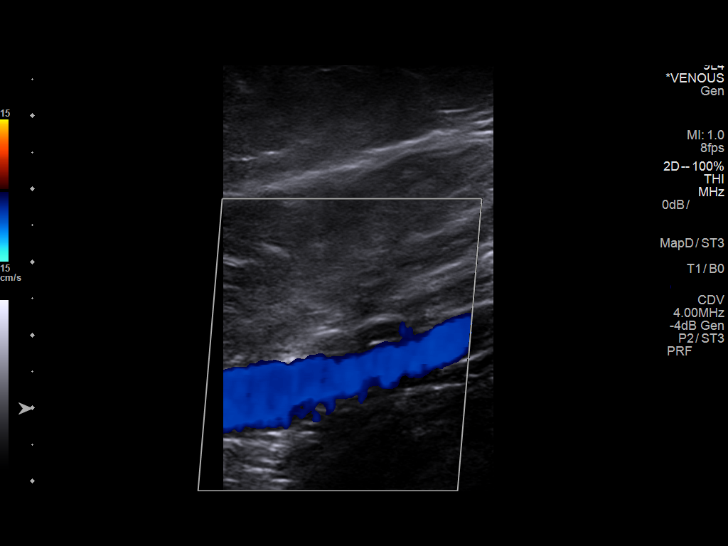
[im 26/36]
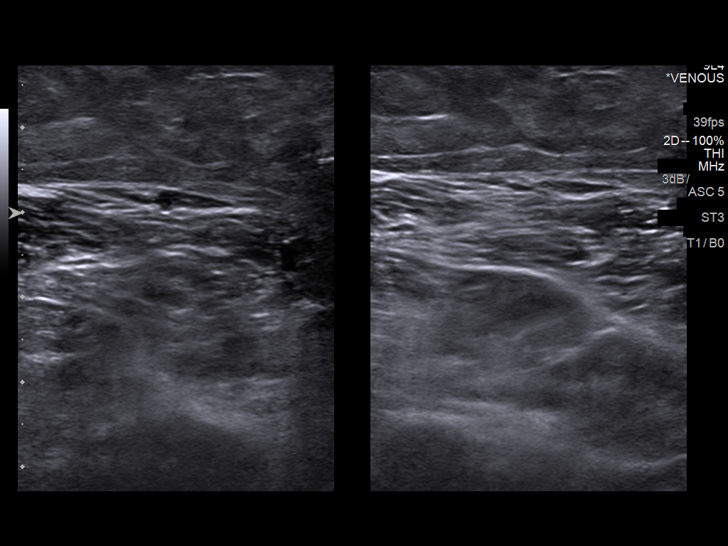
[im 29/36]
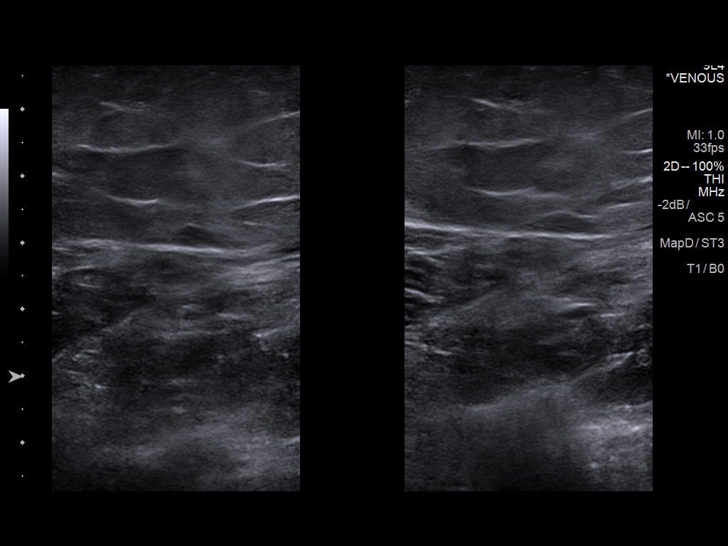
[im 32/36]
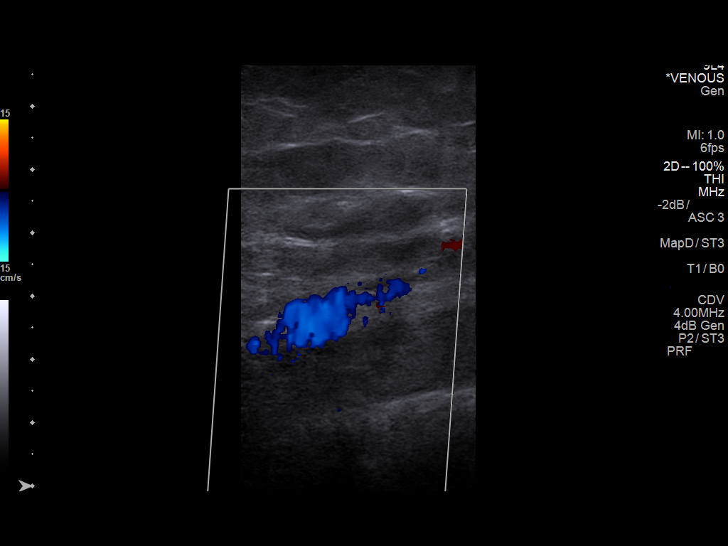
[im 36/36]
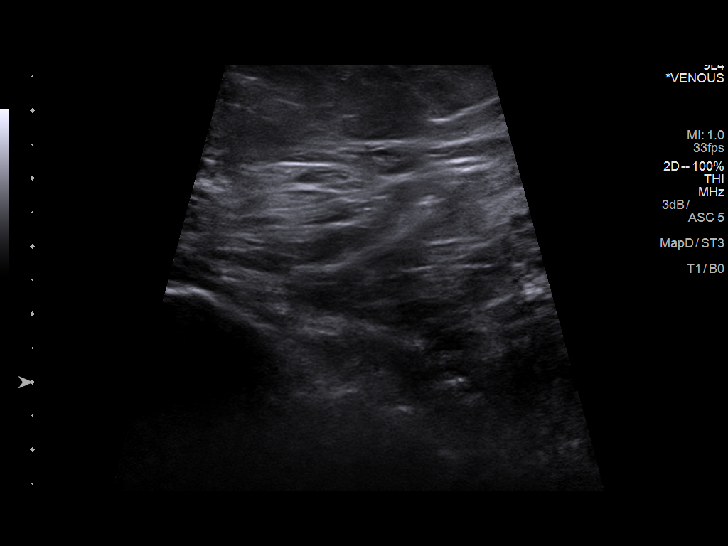

[13 of 24 positions shown; findings below may reference images not displayed]

FINDINGS: Contralateral Common Femoral Vein: Respiratory phasicity is normal
and symmetric with the symptomatic side. No evidence of thrombus.
Normal compressibility.

Common Femoral Vein: No evidence of thrombus. Normal
compressibility, respiratory phasicity and response to augmentation.

Saphenofemoral Junction: No evidence of thrombus. Normal
compressibility and flow on color Doppler imaging.

Profunda Femoral Vein: No evidence of thrombus. Normal
compressibility and flow on color Doppler imaging.

Femoral Vein: No evidence of thrombus. Normal compressibility,
respiratory phasicity and response to augmentation.

Popliteal Vein: No evidence of thrombus. Normal compressibility,
respiratory phasicity and response to augmentation.

Calf Veins: No evidence of thrombus. Normal compressibility and flow
on color Doppler imaging.

Superficial Great Saphenous Vein: No evidence of thrombus. Normal
compressibility.

Venous Reflux:  None.

Other Findings:  None.
IMPRESSION: No evidence of deep venous thrombosis.
